# Patient Record
Sex: Male | Born: 2000 | Race: White | Hispanic: No | Marital: Single | Smoking: Never smoker
Health system: Southern US, Community
[De-identification: ages and names within clinical notes are randomized; demographics above are authoritative.]

## PROBLEM LIST (undated history)

## (undated) DIAGNOSIS — T63481A Toxic effect of venom of other arthropod, accidental (unintentional), initial encounter: Secondary | ICD-10-CM

## (undated) DIAGNOSIS — Z91018 Allergy to other foods: Secondary | ICD-10-CM

## (undated) HISTORY — PX: WISDOM TOOTH EXTRACTION: SHX21

## (undated) HISTORY — DX: Allergy to other foods: Z91.018

## (undated) HISTORY — DX: Toxic effect of venom of other arthropod, accidental (unintentional), initial encounter: T63.481A

## (undated) HISTORY — PX: CIRCUMCISION REVISION: SHX1347

---
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
## 2021-08-23 ENCOUNTER — Encounter: Payer: Self-pay | Admitting: Allergy and Immunology

## 2021-08-23 ENCOUNTER — Ambulatory Visit: Payer: Self-pay | Admitting: Allergy and Immunology

## 2021-08-23 ENCOUNTER — Other Ambulatory Visit: Payer: Self-pay

## 2021-08-23 VITALS — BP 128/66 | HR 94 | Resp 19 | Ht 68.0 in | Wt 175.0 lb

## 2021-08-23 DIAGNOSIS — T782XXD Anaphylactic shock, unspecified, subsequent encounter: Secondary | ICD-10-CM

## 2021-08-23 DIAGNOSIS — T63481D Toxic effect of venom of other arthropod, accidental (unintentional), subsequent encounter: Secondary | ICD-10-CM

## 2021-08-23 NOTE — Patient Instructions (Addendum)
  1.  Avoidance measures regarding hymenoptera exposure  2.  Auvi-Q /EpiPen, Benadryl, MD/ER evaluation for allergic reaction  3.  Can use a combination of the following during "stinging insect season":  A. Loratadine 10 mg - 1 tablet 1 time per day B. Famotidine 20 mg - 1 talblet 1 time per day  4. Can consider immunotherapy. Will need blood tests to proceed.  5. Return to clinic in 12 months or earlier if needed.  6. Obtain fall flu vaccine
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
## 2021-08-24 ENCOUNTER — Telehealth: Payer: Self-pay | Admitting: *Deleted

## 2021-08-24 ENCOUNTER — Encounter: Payer: Self-pay | Admitting: Allergy and Immunology

## 2021-08-24 NOTE — Addendum Note (Signed)
Addended by: Maryjean Morn D on: 08/24/2021 02:48 PM   Modules accepted: Orders

## 2021-08-24 NOTE — Telephone Encounter (Signed)
Kyle Key would like to proceed with the venom testing- hymenoptera panel ordered and I explained to him that he can go to any [facility] facility closest to him at his convenience and to take a copy of his insurance card and photo id. We will call him once we get results in 1-2 weeks and give him pricing for venom injections at that time. He expressed understanding.

## 2021-09-16 LAB — ALLERGEN HYMENOPTERA PANEL
Bumblebee: <0.1 kU/L
Honeybee IgE: <0.1 kU/L
Hornet, White Face, IgE: 0.17 kU/L — AB
Hornet, Yellow, IgE: 0.14 kU/L — AB
Paper Wasp IgE: 0.48 kU/L — AB
Yellow Jacket, IgE: 0.52 kU/L — AB

## 2021-09-20 ENCOUNTER — Ambulatory Visit: Payer: Self-pay

## 2021-09-20 ENCOUNTER — Ambulatory Visit (INDEPENDENT_AMBULATORY_CARE_PROVIDER_SITE_OTHER): Payer: Self-pay

## 2021-09-20 ENCOUNTER — Other Ambulatory Visit: Payer: Self-pay

## 2021-09-20 DIAGNOSIS — T782XXD Anaphylactic shock, unspecified, subsequent encounter: Secondary | ICD-10-CM

## 2021-09-20 DIAGNOSIS — T63481D Toxic effect of venom of other arthropod, accidental (unintentional), subsequent encounter: Secondary | ICD-10-CM

## 2021-09-21 NOTE — Progress Notes (Signed)
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 Consent was signed and patient has already an epi pen.  Patient is self pay and I inform him of the estimate that was provided by billing. For self pay with the 57% discount the estimated total is $124.70. 2 injections are $40 with the 57% discount it would bring it to $17.20. 4 venoms are $250 with 57% discount it would be $107.50. So add $107.50 plus $17.20 and it would be $124.70.  Patient agreed with payment plan and would like to continue with immunotherapy.  Patient waited 30 minutes in office without any issues.

## 2021-09-27 ENCOUNTER — Other Ambulatory Visit: Payer: Self-pay

## 2021-09-27 ENCOUNTER — Ambulatory Visit (INDEPENDENT_AMBULATORY_CARE_PROVIDER_SITE_OTHER): Payer: Self-pay

## 2021-09-27 DIAGNOSIS — T782XXD Anaphylactic shock, unspecified, subsequent encounter: Secondary | ICD-10-CM

## 2021-09-27 DIAGNOSIS — T63481D Toxic effect of venom of other arthropod, accidental (unintentional), subsequent encounter: Secondary | ICD-10-CM

## 2021-10-04 ENCOUNTER — Other Ambulatory Visit: Payer: Self-pay

## 2021-10-04 ENCOUNTER — Ambulatory Visit (INDEPENDENT_AMBULATORY_CARE_PROVIDER_SITE_OTHER): Payer: Self-pay | Admitting: *Deleted

## 2021-10-04 DIAGNOSIS — T63481D Toxic effect of venom of other arthropod, accidental (unintentional), subsequent encounter: Secondary | ICD-10-CM

## 2021-10-04 DIAGNOSIS — T782XXD Anaphylactic shock, unspecified, subsequent encounter: Secondary | ICD-10-CM

## 2021-10-13 ENCOUNTER — Ambulatory Visit (INDEPENDENT_AMBULATORY_CARE_PROVIDER_SITE_OTHER): Payer: Self-pay

## 2021-10-13 ENCOUNTER — Other Ambulatory Visit: Payer: Self-pay

## 2021-10-13 DIAGNOSIS — T63481D Toxic effect of venom of other arthropod, accidental (unintentional), subsequent encounter: Secondary | ICD-10-CM

## 2021-10-13 DIAGNOSIS — T782XXD Anaphylactic shock, unspecified, subsequent encounter: Secondary | ICD-10-CM

## 2021-10-18 ENCOUNTER — Ambulatory Visit: Payer: Self-pay

## 2021-10-20 ENCOUNTER — Other Ambulatory Visit: Payer: Self-pay

## 2021-10-20 ENCOUNTER — Ambulatory Visit (INDEPENDENT_AMBULATORY_CARE_PROVIDER_SITE_OTHER): Payer: Self-pay | Admitting: *Deleted

## 2021-10-20 DIAGNOSIS — T63481D Toxic effect of venom of other arthropod, accidental (unintentional), subsequent encounter: Secondary | ICD-10-CM

## 2021-10-20 DIAGNOSIS — T782XXD Anaphylactic shock, unspecified, subsequent encounter: Secondary | ICD-10-CM

## 2021-10-27 ENCOUNTER — Ambulatory Visit (INDEPENDENT_AMBULATORY_CARE_PROVIDER_SITE_OTHER): Payer: Self-pay | Admitting: *Deleted

## 2021-10-27 DIAGNOSIS — T63481D Toxic effect of venom of other arthropod, accidental (unintentional), subsequent encounter: Secondary | ICD-10-CM

## 2021-10-27 DIAGNOSIS — T782XXD Anaphylactic shock, unspecified, subsequent encounter: Secondary | ICD-10-CM

## 2021-11-03 ENCOUNTER — Ambulatory Visit (INDEPENDENT_AMBULATORY_CARE_PROVIDER_SITE_OTHER): Payer: Self-pay

## 2021-11-03 DIAGNOSIS — T63481D Toxic effect of venom of other arthropod, accidental (unintentional), subsequent encounter: Secondary | ICD-10-CM

## 2021-11-03 DIAGNOSIS — T782XXD Anaphylactic shock, unspecified, subsequent encounter: Secondary | ICD-10-CM

## 2021-11-08 ENCOUNTER — Ambulatory Visit (INDEPENDENT_AMBULATORY_CARE_PROVIDER_SITE_OTHER): Payer: Self-pay

## 2021-11-08 DIAGNOSIS — T782XXD Anaphylactic shock, unspecified, subsequent encounter: Secondary | ICD-10-CM

## 2021-11-08 DIAGNOSIS — T63481D Toxic effect of venom of other arthropod, accidental (unintentional), subsequent encounter: Secondary | ICD-10-CM

## 2021-11-15 ENCOUNTER — Ambulatory Visit (INDEPENDENT_AMBULATORY_CARE_PROVIDER_SITE_OTHER): Payer: Self-pay | Admitting: *Deleted

## 2021-11-15 DIAGNOSIS — T63481D Toxic effect of venom of other arthropod, accidental (unintentional), subsequent encounter: Secondary | ICD-10-CM

## 2021-11-15 DIAGNOSIS — T782XXD Anaphylactic shock, unspecified, subsequent encounter: Secondary | ICD-10-CM

## 2021-11-29 ENCOUNTER — Ambulatory Visit (INDEPENDENT_AMBULATORY_CARE_PROVIDER_SITE_OTHER): Payer: Self-pay | Admitting: *Deleted

## 2021-11-29 DIAGNOSIS — T782XXD Anaphylactic shock, unspecified, subsequent encounter: Secondary | ICD-10-CM

## 2021-11-29 DIAGNOSIS — T63481D Toxic effect of venom of other arthropod, accidental (unintentional), subsequent encounter: Secondary | ICD-10-CM

## 2021-12-09 ENCOUNTER — Ambulatory Visit (INDEPENDENT_AMBULATORY_CARE_PROVIDER_SITE_OTHER): Payer: Self-pay | Admitting: *Deleted

## 2021-12-09 DIAGNOSIS — T63481D Toxic effect of venom of other arthropod, accidental (unintentional), subsequent encounter: Secondary | ICD-10-CM

## 2021-12-09 DIAGNOSIS — T782XXD Anaphylactic shock, unspecified, subsequent encounter: Secondary | ICD-10-CM

## 2021-12-16 ENCOUNTER — Ambulatory Visit (INDEPENDENT_AMBULATORY_CARE_PROVIDER_SITE_OTHER): Payer: Self-pay | Admitting: *Deleted

## 2021-12-16 DIAGNOSIS — T63481D Toxic effect of venom of other arthropod, accidental (unintentional), subsequent encounter: Secondary | ICD-10-CM

## 2021-12-16 DIAGNOSIS — T782XXD Anaphylactic shock, unspecified, subsequent encounter: Secondary | ICD-10-CM

## 2021-12-22 ENCOUNTER — Ambulatory Visit (INDEPENDENT_AMBULATORY_CARE_PROVIDER_SITE_OTHER): Payer: Self-pay | Admitting: *Deleted

## 2021-12-22 DIAGNOSIS — T782XXD Anaphylactic shock, unspecified, subsequent encounter: Secondary | ICD-10-CM

## 2021-12-22 DIAGNOSIS — T63481D Toxic effect of venom of other arthropod, accidental (unintentional), subsequent encounter: Secondary | ICD-10-CM

## 2021-12-30 ENCOUNTER — Ambulatory Visit (INDEPENDENT_AMBULATORY_CARE_PROVIDER_SITE_OTHER): Payer: Self-pay | Admitting: *Deleted

## 2021-12-30 DIAGNOSIS — T782XXD Anaphylactic shock, unspecified, subsequent encounter: Secondary | ICD-10-CM

## 2021-12-30 DIAGNOSIS — T63481D Toxic effect of venom of other arthropod, accidental (unintentional), subsequent encounter: Secondary | ICD-10-CM

## 2022-01-11 ENCOUNTER — Ambulatory Visit (INDEPENDENT_AMBULATORY_CARE_PROVIDER_SITE_OTHER): Payer: Self-pay | Admitting: *Deleted

## 2022-01-11 DIAGNOSIS — T782XXD Anaphylactic shock, unspecified, subsequent encounter: Secondary | ICD-10-CM

## 2022-01-11 DIAGNOSIS — T63481D Toxic effect of venom of other arthropod, accidental (unintentional), subsequent encounter: Secondary | ICD-10-CM

## 2022-01-20 ENCOUNTER — Ambulatory Visit (INDEPENDENT_AMBULATORY_CARE_PROVIDER_SITE_OTHER): Payer: Self-pay | Admitting: *Deleted

## 2022-01-20 DIAGNOSIS — T782XXD Anaphylactic shock, unspecified, subsequent encounter: Secondary | ICD-10-CM

## 2022-01-20 DIAGNOSIS — T63481D Toxic effect of venom of other arthropod, accidental (unintentional), subsequent encounter: Secondary | ICD-10-CM

## 2022-01-27 ENCOUNTER — Ambulatory Visit (INDEPENDENT_AMBULATORY_CARE_PROVIDER_SITE_OTHER): Payer: Self-pay | Admitting: *Deleted

## 2022-01-27 DIAGNOSIS — T782XXD Anaphylactic shock, unspecified, subsequent encounter: Secondary | ICD-10-CM

## 2022-01-27 DIAGNOSIS — T63481D Toxic effect of venom of other arthropod, accidental (unintentional), subsequent encounter: Secondary | ICD-10-CM

## 2022-02-03 ENCOUNTER — Ambulatory Visit (INDEPENDENT_AMBULATORY_CARE_PROVIDER_SITE_OTHER): Payer: Self-pay | Admitting: *Deleted

## 2022-02-03 DIAGNOSIS — T782XXD Anaphylactic shock, unspecified, subsequent encounter: Secondary | ICD-10-CM

## 2022-02-03 DIAGNOSIS — T63481D Toxic effect of venom of other arthropod, accidental (unintentional), subsequent encounter: Secondary | ICD-10-CM

## 2022-02-09 ENCOUNTER — Ambulatory Visit (INDEPENDENT_AMBULATORY_CARE_PROVIDER_SITE_OTHER): Payer: Self-pay

## 2022-02-09 DIAGNOSIS — T63481D Toxic effect of venom of other arthropod, accidental (unintentional), subsequent encounter: Secondary | ICD-10-CM

## 2022-02-09 DIAGNOSIS — T782XXD Anaphylactic shock, unspecified, subsequent encounter: Secondary | ICD-10-CM

## 2022-02-15 ENCOUNTER — Ambulatory Visit (INDEPENDENT_AMBULATORY_CARE_PROVIDER_SITE_OTHER): Payer: Self-pay | Admitting: *Deleted

## 2022-02-15 DIAGNOSIS — T782XXD Anaphylactic shock, unspecified, subsequent encounter: Secondary | ICD-10-CM

## 2022-02-15 DIAGNOSIS — T63481D Toxic effect of venom of other arthropod, accidental (unintentional), subsequent encounter: Secondary | ICD-10-CM

## 2022-02-24 ENCOUNTER — Ambulatory Visit (INDEPENDENT_AMBULATORY_CARE_PROVIDER_SITE_OTHER): Payer: Self-pay | Admitting: *Deleted

## 2022-02-24 DIAGNOSIS — T782XXD Anaphylactic shock, unspecified, subsequent encounter: Secondary | ICD-10-CM

## 2022-02-24 DIAGNOSIS — T63481D Toxic effect of venom of other arthropod, accidental (unintentional), subsequent encounter: Secondary | ICD-10-CM

## 2022-03-03 ENCOUNTER — Ambulatory Visit (INDEPENDENT_AMBULATORY_CARE_PROVIDER_SITE_OTHER): Payer: Self-pay | Admitting: *Deleted

## 2022-03-03 DIAGNOSIS — T782XXD Anaphylactic shock, unspecified, subsequent encounter: Secondary | ICD-10-CM

## 2022-03-03 DIAGNOSIS — T63481D Toxic effect of venom of other arthropod, accidental (unintentional), subsequent encounter: Secondary | ICD-10-CM

## 2022-03-10 ENCOUNTER — Ambulatory Visit (INDEPENDENT_AMBULATORY_CARE_PROVIDER_SITE_OTHER): Payer: Self-pay | Admitting: *Deleted

## 2022-03-10 DIAGNOSIS — T63481D Toxic effect of venom of other arthropod, accidental (unintentional), subsequent encounter: Secondary | ICD-10-CM

## 2022-03-10 DIAGNOSIS — T782XXD Anaphylactic shock, unspecified, subsequent encounter: Secondary | ICD-10-CM

## 2022-03-15 ENCOUNTER — Ambulatory Visit (INDEPENDENT_AMBULATORY_CARE_PROVIDER_SITE_OTHER): Payer: Self-pay | Admitting: *Deleted

## 2022-03-15 DIAGNOSIS — T63481D Toxic effect of venom of other arthropod, accidental (unintentional), subsequent encounter: Secondary | ICD-10-CM

## 2022-03-15 DIAGNOSIS — T782XXD Anaphylactic shock, unspecified, subsequent encounter: Secondary | ICD-10-CM

## 2022-03-24 ENCOUNTER — Ambulatory Visit (INDEPENDENT_AMBULATORY_CARE_PROVIDER_SITE_OTHER): Payer: Self-pay | Admitting: *Deleted

## 2022-03-24 DIAGNOSIS — T782XXD Anaphylactic shock, unspecified, subsequent encounter: Secondary | ICD-10-CM

## 2022-03-24 DIAGNOSIS — T63481D Toxic effect of venom of other arthropod, accidental (unintentional), subsequent encounter: Secondary | ICD-10-CM

## 2022-03-30 ENCOUNTER — Ambulatory Visit (INDEPENDENT_AMBULATORY_CARE_PROVIDER_SITE_OTHER): Payer: Self-pay | Admitting: *Deleted

## 2022-03-30 DIAGNOSIS — T63441D Toxic effect of venom of bees, accidental (unintentional), subsequent encounter: Secondary | ICD-10-CM

## 2022-04-05 ENCOUNTER — Ambulatory Visit (INDEPENDENT_AMBULATORY_CARE_PROVIDER_SITE_OTHER): Payer: Self-pay | Admitting: *Deleted

## 2022-04-05 DIAGNOSIS — T63441D Toxic effect of venom of bees, accidental (unintentional), subsequent encounter: Secondary | ICD-10-CM

## 2022-04-12 ENCOUNTER — Ambulatory Visit (INDEPENDENT_AMBULATORY_CARE_PROVIDER_SITE_OTHER): Payer: Self-pay | Admitting: *Deleted

## 2022-04-12 DIAGNOSIS — T63441D Toxic effect of venom of bees, accidental (unintentional), subsequent encounter: Secondary | ICD-10-CM

## 2022-04-19 ENCOUNTER — Ambulatory Visit (INDEPENDENT_AMBULATORY_CARE_PROVIDER_SITE_OTHER): Payer: Self-pay | Admitting: *Deleted

## 2022-04-19 DIAGNOSIS — T63441D Toxic effect of venom of bees, accidental (unintentional), subsequent encounter: Secondary | ICD-10-CM

## 2022-05-05 ENCOUNTER — Ambulatory Visit (INDEPENDENT_AMBULATORY_CARE_PROVIDER_SITE_OTHER): Payer: Self-pay | Admitting: *Deleted

## 2022-05-05 DIAGNOSIS — T63441D Toxic effect of venom of bees, accidental (unintentional), subsequent encounter: Secondary | ICD-10-CM

## 2022-05-17 ENCOUNTER — Ambulatory Visit (INDEPENDENT_AMBULATORY_CARE_PROVIDER_SITE_OTHER): Payer: Self-pay | Admitting: *Deleted

## 2022-05-17 DIAGNOSIS — T63441D Toxic effect of venom of bees, accidental (unintentional), subsequent encounter: Secondary | ICD-10-CM

## 2022-05-24 ENCOUNTER — Ambulatory Visit (INDEPENDENT_AMBULATORY_CARE_PROVIDER_SITE_OTHER): Payer: Self-pay | Admitting: *Deleted

## 2022-05-24 DIAGNOSIS — T63441D Toxic effect of venom of bees, accidental (unintentional), subsequent encounter: Secondary | ICD-10-CM

## 2022-06-13 ENCOUNTER — Ambulatory Visit (INDEPENDENT_AMBULATORY_CARE_PROVIDER_SITE_OTHER): Payer: Self-pay | Admitting: *Deleted

## 2022-06-13 DIAGNOSIS — T63441D Toxic effect of venom of bees, accidental (unintentional), subsequent encounter: Secondary | ICD-10-CM

## 2022-07-04 ENCOUNTER — Ambulatory Visit (INDEPENDENT_AMBULATORY_CARE_PROVIDER_SITE_OTHER): Payer: Self-pay | Admitting: *Deleted

## 2022-07-04 DIAGNOSIS — T63441D Toxic effect of venom of bees, accidental (unintentional), subsequent encounter: Secondary | ICD-10-CM

## 2022-08-01 ENCOUNTER — Ambulatory Visit (INDEPENDENT_AMBULATORY_CARE_PROVIDER_SITE_OTHER): Payer: Self-pay | Admitting: *Deleted

## 2022-08-01 DIAGNOSIS — T63441D Toxic effect of venom of bees, accidental (unintentional), subsequent encounter: Secondary | ICD-10-CM

## 2022-08-29 ENCOUNTER — Ambulatory Visit: Payer: Self-pay

## 2022-08-30 ENCOUNTER — Ambulatory Visit (INDEPENDENT_AMBULATORY_CARE_PROVIDER_SITE_OTHER): Payer: Self-pay

## 2022-08-30 DIAGNOSIS — T63441D Toxic effect of venom of bees, accidental (unintentional), subsequent encounter: Secondary | ICD-10-CM

## 2022-10-06 ENCOUNTER — Ambulatory Visit (INDEPENDENT_AMBULATORY_CARE_PROVIDER_SITE_OTHER): Payer: Self-pay | Admitting: *Deleted

## 2022-10-06 DIAGNOSIS — T63441D Toxic effect of venom of bees, accidental (unintentional), subsequent encounter: Secondary | ICD-10-CM

## 2022-11-03 ENCOUNTER — Ambulatory Visit: Payer: Medicaid Other

## 2022-11-08 ENCOUNTER — Ambulatory Visit (INDEPENDENT_AMBULATORY_CARE_PROVIDER_SITE_OTHER): Payer: Medicaid Other | Admitting: *Deleted

## 2022-11-08 DIAGNOSIS — T63441D Toxic effect of venom of bees, accidental (unintentional), subsequent encounter: Secondary | ICD-10-CM | POA: Diagnosis not present

## 2022-12-06 ENCOUNTER — Ambulatory Visit: Payer: Medicaid Other

## 2022-12-14 ENCOUNTER — Ambulatory Visit (INDEPENDENT_AMBULATORY_CARE_PROVIDER_SITE_OTHER): Payer: Medicaid Other | Admitting: *Deleted

## 2022-12-14 DIAGNOSIS — T63441D Toxic effect of venom of bees, accidental (unintentional), subsequent encounter: Secondary | ICD-10-CM | POA: Diagnosis not present

## 2023-01-11 ENCOUNTER — Ambulatory Visit (INDEPENDENT_AMBULATORY_CARE_PROVIDER_SITE_OTHER): Payer: Medicaid Other | Admitting: *Deleted

## 2023-01-11 DIAGNOSIS — T63441D Toxic effect of venom of bees, accidental (unintentional), subsequent encounter: Secondary | ICD-10-CM

## 2023-01-12 ENCOUNTER — Ambulatory Visit: Payer: Medicaid Other

## 2023-02-02 ENCOUNTER — Ambulatory Visit: Payer: Medicaid Other | Admitting: Allergy and Immunology

## 2023-02-08 ENCOUNTER — Ambulatory Visit (INDEPENDENT_AMBULATORY_CARE_PROVIDER_SITE_OTHER): Payer: Medicaid Other

## 2023-02-08 ENCOUNTER — Other Ambulatory Visit: Payer: Self-pay | Admitting: Allergy and Immunology

## 2023-02-08 DIAGNOSIS — T63441D Toxic effect of venom of bees, accidental (unintentional), subsequent encounter: Secondary | ICD-10-CM

## 2023-02-08 DIAGNOSIS — T7800XA Anaphylactic reaction due to unspecified food, initial encounter: Secondary | ICD-10-CM

## 2023-02-18 LAB — ALPHA-GAL PANEL
Allergen Lamb IgE: 1.12 kU/L — AB
Beef IgE: 1.45 kU/L — AB
IgE (Immunoglobulin E), Serum: 36 IU/mL (ref 6–495)
O215-IgE Alpha-Gal: 4.48 kU/L — AB
Pork IgE: 0.88 kU/L — AB

## 2023-03-07 ENCOUNTER — Ambulatory Visit (INDEPENDENT_AMBULATORY_CARE_PROVIDER_SITE_OTHER): Payer: Medicaid Other

## 2023-03-07 DIAGNOSIS — T63441D Toxic effect of venom of bees, accidental (unintentional), subsequent encounter: Secondary | ICD-10-CM

## 2023-03-08 ENCOUNTER — Ambulatory Visit: Payer: Medicaid Other

## 2023-03-27 ENCOUNTER — Encounter: Payer: Self-pay | Admitting: Allergy and Immunology

## 2023-03-27 ENCOUNTER — Ambulatory Visit (INDEPENDENT_AMBULATORY_CARE_PROVIDER_SITE_OTHER): Payer: Medicaid Other | Admitting: Allergy and Immunology

## 2023-03-27 VITALS — BP 114/72 | HR 79 | Resp 16 | Ht 68.0 in | Wt 182.4 lb

## 2023-03-27 DIAGNOSIS — T7800XA Anaphylactic reaction due to unspecified food, initial encounter: Secondary | ICD-10-CM | POA: Diagnosis not present

## 2023-03-27 DIAGNOSIS — T63481D Toxic effect of venom of other arthropod, accidental (unintentional), subsequent encounter: Secondary | ICD-10-CM | POA: Diagnosis not present

## 2023-03-27 DIAGNOSIS — T782XXD Anaphylactic shock, unspecified, subsequent encounter: Secondary | ICD-10-CM

## 2023-03-27 NOTE — Patient Instructions (Addendum)
  1.  Avoidance measures - venom, mammal, tick  2.  Auvi-Q /EpiPen, Benadryl, MD/ER evaluation for allergic reaction  3.  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  Return to clinic in 12 months or earlier if needed.  5.  Obtain fall flu vaccine  6.  Check alpha-gal levels every year

## 2023-03-27 NOTE — Progress Notes (Signed)
[place] [facility] [place] - [place] - [place] - [place]   Follow-up Note  Referring Provider: Nonnie Done., MD Primary Provider: Nonnie Done., MD Date of Office Visit: 03/27/2023  Subjective:   Kyle Key (DOB: 26-Jun-2001) is a 22 y.o. male who [facility] in re-evaluation of the following:  HPI: Kyle Key returns to this clinic in evaluation of hymenoptera venom hypersensitivity.  I last saw him in this clinic 23 August 2021.  He is receiving immunotherapy against mixed vespid and wasp every 4 weeks without any adverse effects.  He does have an injectable epinephrine device.  He has developed alpha gal syndrome.  During August 2024 he realized that after eating mammal he would develop intense gastrointestinal distress and cramping.  He has been mammal free since that point in time.  Blood tests obtained April 2024 confirmed alpha gal IgE.  Allergies as of 03/27/2023       Reactions   Bee Venom    Amoxicillin Rash        Medication List    EPINEPHrine 0.3 mg/0.3 mL Soaj injection Commonly known as: EPI-PEN Inject into the muscle as directed.    Past Medical History:  Diagnosis Date   Allergy to alpha-gal    Anaphylaxis due to hymenoptera venom     Past Surgical History:  Procedure Laterality Date   CIRCUMCISION REVISION     WISDOM TOOTH EXTRACTION      Review of systems negative except as noted in HPI / PMHx or noted below:  Review of Systems  Constitutional: Negative.   HENT: Negative.    Eyes: Negative.   Respiratory: Negative.    Cardiovascular: Negative.   Gastrointestinal: Negative.   Genitourinary: Negative.   Musculoskeletal: Negative.   Skin: Negative.   Neurological: Negative.   Endo/Heme/Allergies: Negative.   Psychiatric/Behavioral: Negative.       Objective:   Vitals:   03/27/23 1803  BP: 114/72  Pulse: 79  Resp: 16  SpO2: 99%   Height: 5\' 8"  (172.7 cm)  Weight: 182 lb 6.4  oz (82.7 kg)   Physical Exam Constitutional:      Appearance: He is not diaphoretic.  HENT:     Head: Normocephalic.     Right Ear: Tympanic membrane, ear canal and external ear normal.     Left Ear: Tympanic membrane, ear canal and external ear normal.     Nose: Nose normal. No mucosal edema or rhinorrhea.     Mouth/Throat:     Pharynx: Uvula midline. No oropharyngeal exudate.  Eyes:     Conjunctiva/sclera: Conjunctivae normal.  Neck:     Thyroid: No thyromegaly.     Trachea: Trachea normal. No tracheal tenderness or tracheal deviation.  Cardiovascular:     Rate and Rhythm: Normal rate and regular rhythm.     Heart sounds: Normal heart sounds, S1 normal and S2 normal. No murmur heard. Pulmonary:     Effort: No respiratory distress.     Breath sounds: Normal breath sounds. No stridor. No wheezing or rales.  Lymphadenopathy:     Head:     Right side of head: No tonsillar adenopathy.     Left side of head: No tonsillar adenopathy.     Cervical: No cervical adenopathy.  Skin:    Findings: No erythema or rash.     Nails: There is no clubbing.  Neurological:     Mental Status: He is alert.     Diagnostics: Results  of blood tests obtained 13 February 2023 identifies alpha gal IgE 4.48 KU/L, beef 1.45 KU/L, pork 0.88 KU/L, lamb 1.12 KU/L.  Results of blood tests obtained 08 September 2021 identifies IgE antibodies against yellow faced hornet 0.14 KU/L, yellowjacket 0.52 KU/L, white faced hornet 0.17 KU/L, wasp 0.48 KU/L   Assessment and Plan:   1. Anaphylaxis due to hymenoptera venom, accidental or unintentional, subsequent encounter   2. Allergy with anaphylaxis due to food    1.  Avoidance measures - venom, mammal, tick  2.  Auvi-Q /EpiPen, Benadryl, MD/ER evaluation for allergic reaction  3.  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  Return to clinic in 12 months or earlier if needed.  5.  Obtain fall flu vaccine  6.  Check alpha-gal levels every year  Kyle Key  appears to have sensitivity directed against hymenoptera venom and alpha gal and he needs to perform allergen avoidance measures as best possible against both and continue on immunotherapy directed against mixed vespid and wasp.  We will check alpha gal levels every year to see if he is starting to decrease his titers suggesting that maybe he is losing his mammal sensitivity.  With an alpha-gal level of 4.48 in April 2024 I am not very encouraged that this situation is going to resolve anytime soon.  Laurette Schimke, MD Allergy / Immunology [facility]

## 2023-03-28 ENCOUNTER — Encounter: Payer: Self-pay | Admitting: Allergy and Immunology

## 2023-04-05 ENCOUNTER — Ambulatory Visit (INDEPENDENT_AMBULATORY_CARE_PROVIDER_SITE_OTHER): Payer: Medicaid Other

## 2023-04-05 DIAGNOSIS — T782XXD Anaphylactic shock, unspecified, subsequent encounter: Secondary | ICD-10-CM | POA: Diagnosis not present

## 2023-04-05 DIAGNOSIS — T63481D Toxic effect of venom of other arthropod, accidental (unintentional), subsequent encounter: Secondary | ICD-10-CM

## 2023-05-03 ENCOUNTER — Ambulatory Visit (INDEPENDENT_AMBULATORY_CARE_PROVIDER_SITE_OTHER): Payer: Medicaid Other

## 2023-05-03 DIAGNOSIS — T782XXD Anaphylactic shock, unspecified, subsequent encounter: Secondary | ICD-10-CM

## 2023-05-03 DIAGNOSIS — T63481D Toxic effect of venom of other arthropod, accidental (unintentional), subsequent encounter: Secondary | ICD-10-CM | POA: Diagnosis not present

## 2023-05-10 ENCOUNTER — Other Ambulatory Visit: Payer: Self-pay

## 2023-05-10 MED ORDER — EPINEPHRINE 0.3 MG/0.3ML IJ SOAJ: 0.3000 mg | INTRAMUSCULAR | 3 refills | Status: AC | PRN

## 2023-05-17 ENCOUNTER — Ambulatory Visit: Payer: Medicaid Other

## 2023-05-29 ENCOUNTER — Ambulatory Visit (INDEPENDENT_AMBULATORY_CARE_PROVIDER_SITE_OTHER): Payer: Medicaid Other | Admitting: *Deleted

## 2023-05-29 DIAGNOSIS — T782XXD Anaphylactic shock, unspecified, subsequent encounter: Secondary | ICD-10-CM

## 2023-05-29 DIAGNOSIS — T63481D Toxic effect of venom of other arthropod, accidental (unintentional), subsequent encounter: Secondary | ICD-10-CM | POA: Diagnosis not present

## 2023-05-31 ENCOUNTER — Ambulatory Visit: Payer: Medicaid Other

## 2023-06-26 ENCOUNTER — Ambulatory Visit (INDEPENDENT_AMBULATORY_CARE_PROVIDER_SITE_OTHER): Payer: Medicaid Other | Admitting: *Deleted

## 2023-06-26 DIAGNOSIS — T782XXD Anaphylactic shock, unspecified, subsequent encounter: Secondary | ICD-10-CM | POA: Diagnosis not present

## 2023-06-26 DIAGNOSIS — T63481D Toxic effect of venom of other arthropod, accidental (unintentional), subsequent encounter: Secondary | ICD-10-CM | POA: Diagnosis not present

## 2023-07-07 ENCOUNTER — Other Ambulatory Visit: Payer: Self-pay | Admitting: Orthopedic Surgery

## 2023-07-07 DIAGNOSIS — M25562 Pain in left knee: Secondary | ICD-10-CM

## 2023-07-14 ENCOUNTER — Encounter: Payer: Self-pay | Admitting: Orthopedic Surgery

## 2023-07-17 ENCOUNTER — Inpatient Hospital Stay: Admission: RE | Admit: 2023-07-17 | Payer: Medicaid Other | Source: Ambulatory Visit

## 2023-07-27 ENCOUNTER — Encounter: Payer: Self-pay | Admitting: Orthopedic Surgery

## 2023-08-07 ENCOUNTER — Ambulatory Visit (INDEPENDENT_AMBULATORY_CARE_PROVIDER_SITE_OTHER): Payer: Medicaid Other

## 2023-08-07 DIAGNOSIS — T63481D Toxic effect of venom of other arthropod, accidental (unintentional), subsequent encounter: Secondary | ICD-10-CM

## 2023-08-07 DIAGNOSIS — T782XXD Anaphylactic shock, unspecified, subsequent encounter: Secondary | ICD-10-CM | POA: Diagnosis not present

## 2023-09-18 ENCOUNTER — Ambulatory Visit (INDEPENDENT_AMBULATORY_CARE_PROVIDER_SITE_OTHER): Payer: Self-pay

## 2023-09-18 DIAGNOSIS — T63481D Toxic effect of venom of other arthropod, accidental (unintentional), subsequent encounter: Secondary | ICD-10-CM

## 2023-09-18 DIAGNOSIS — T782XXD Anaphylactic shock, unspecified, subsequent encounter: Secondary | ICD-10-CM

## 2023-10-23 ENCOUNTER — Ambulatory Visit (INDEPENDENT_AMBULATORY_CARE_PROVIDER_SITE_OTHER): Payer: Self-pay | Admitting: *Deleted

## 2023-10-23 DIAGNOSIS — T782XXD Anaphylactic shock, unspecified, subsequent encounter: Secondary | ICD-10-CM

## 2023-10-23 DIAGNOSIS — T63481D Toxic effect of venom of other arthropod, accidental (unintentional), subsequent encounter: Secondary | ICD-10-CM

## 2023-10-30 ENCOUNTER — Ambulatory Visit: Payer: Self-pay

## 2023-12-04 ENCOUNTER — Ambulatory Visit: Payer: Self-pay
